# Patient Record
Sex: Female | Born: 1935 | Race: Black or African American | Hispanic: No | Marital: Single
Health system: Southern US, Community
[De-identification: ages and names within clinical notes are randomized; demographics above are authoritative.]

## PROBLEM LIST (undated history)

## (undated) DIAGNOSIS — H9190 Unspecified hearing loss, unspecified ear: Secondary | ICD-10-CM

## (undated) DIAGNOSIS — I10 Essential (primary) hypertension: Secondary | ICD-10-CM

---

## 2018-05-08 ENCOUNTER — Emergency Department (HOSPITAL_COMMUNITY): Payer: Self-pay

## 2018-05-08 ENCOUNTER — Other Ambulatory Visit: Payer: Self-pay

## 2018-05-08 ENCOUNTER — Observation Stay (HOSPITAL_COMMUNITY)
Admission: EM | Admit: 2018-05-08 | Discharge: 2018-05-09 | Disposition: A | Discharge status: Home / Self Care | Payer: Self-pay | Attending: Family Medicine | Admitting: Family Medicine

## 2018-05-08 DIAGNOSIS — J9859 Other diseases of mediastinum, not elsewhere classified: Secondary | ICD-10-CM | POA: Insufficient documentation

## 2018-05-08 DIAGNOSIS — I161 Hypertensive emergency: Principal | ICD-10-CM | POA: Insufficient documentation

## 2018-05-08 DIAGNOSIS — R011 Cardiac murmur, unspecified: Secondary | ICD-10-CM | POA: Insufficient documentation

## 2018-05-08 DIAGNOSIS — R404 Transient alteration of awareness: Secondary | ICD-10-CM

## 2018-05-08 DIAGNOSIS — D329 Benign neoplasm of meninges, unspecified: Secondary | ICD-10-CM | POA: Insufficient documentation

## 2018-05-08 LAB — AMMONIA: AMMONIA: 25 umol/L (ref 9–35)

## 2018-05-08 LAB — COMPREHENSIVE METABOLIC PANEL
ALT: 12 U/L (ref 0–44)
AST: 25 U/L (ref 15–41)
Albumin: 3.9 g/dL (ref 3.5–5.0)
Alkaline Phosphatase: 49 U/L (ref 38–126)
Anion gap: 15 (ref 5–15)
BUN: 15 mg/dL (ref 8–23)
CHLORIDE: 101 mmol/L (ref 98–111)
CO2: 26 mmol/L (ref 22–32)
CREATININE: 0.87 mg/dL (ref 0.44–1.00)
Calcium: 9.4 mg/dL (ref 8.9–10.3)
GFR, EST NON AFRICAN AMERICAN: 60 mL/min — AB (ref >60)
Glucose, Bld: 132 mg/dL — ABNORMAL HIGH (ref 70–99)
POTASSIUM: 3.8 mmol/L (ref 3.5–5.1)
Sodium: 142 mmol/L (ref 135–145)
Total Bilirubin: 1 mg/dL (ref 0.3–1.2)
Total Protein: 7.1 g/dL (ref 6.5–8.1)

## 2018-05-08 LAB — URINALYSIS, ROUTINE W REFLEX MICROSCOPIC
BILIRUBIN URINE: NEGATIVE
GLUCOSE, UA: NEGATIVE mg/dL
Hgb urine dipstick: NEGATIVE
KETONES UR: NEGATIVE mg/dL
Leukocytes, UA: NEGATIVE
Nitrite: NEGATIVE
PH: 9 — AB (ref 5.0–8.0)
PROTEIN: NEGATIVE mg/dL
Specific Gravity, Urine: 1.017 (ref 1.005–1.030)

## 2018-05-08 LAB — I-STAT CG4 LACTIC ACID, ED
LACTIC ACID, VENOUS: 1.54 mmol/L (ref 0.5–1.9)
LACTIC ACID, VENOUS: 1.24 mmol/L (ref 0.5–1.9)

## 2018-05-08 LAB — CBC WITH DIFFERENTIAL/PLATELET
Abs Immature Granulocytes: 0 10*3/uL (ref 0.0–0.1)
Basophils Absolute: 0 10*3/uL (ref 0.0–0.1)
Basophils Relative: 1 %
Eosinophils Absolute: 0.2 10*3/uL (ref 0.0–0.7)
Eosinophils Relative: 2 %
HCT: 43.4 % (ref 36.0–46.0)
Hemoglobin: 13.9 g/dL (ref 12.0–15.0)
IMMATURE GRANULOCYTES: 0 %
LYMPHS ABS: 1.6 10*3/uL (ref 0.7–4.0)
Lymphocytes Relative: 26 %
MCH: 29.6 pg (ref 26.0–34.0)
MCHC: 32 g/dL (ref 30.0–36.0)
MCV: 92.5 fL (ref 78.0–100.0)
MONOS PCT: 9 %
Monocytes Absolute: 0.6 10*3/uL (ref 0.1–1.0)
NEUTROS ABS: 3.9 10*3/uL (ref 1.7–7.7)
NEUTROS PCT: 62 %
Platelets: 198 10*3/uL (ref 150–400)
RBC: 4.69 MIL/uL (ref 3.87–5.11)
RDW: 12.8 % (ref 11.5–15.5)
WBC: 6.3 10*3/uL (ref 4.0–10.5)

## 2018-05-08 LAB — CBG MONITORING, ED: Glucose-Capillary: 111 mg/dL — ABNORMAL HIGH (ref 70–99)

## 2018-05-08 LAB — PROTIME-INR
INR: 1.04
PROTHROMBIN TIME: 13.5 s (ref 11.4–15.2)

## 2018-05-08 LAB — RAPID URINE DRUG SCREEN, HOSP PERFORMED
AMPHETAMINES: NOT DETECTED
Barbiturates: NOT DETECTED
Benzodiazepines: NOT DETECTED
Cocaine: NOT DETECTED
Opiates: NOT DETECTED
TETRAHYDROCANNABINOL: NOT DETECTED

## 2018-05-08 LAB — ETHANOL: Alcohol, Ethyl (B): <10 mg/dL (ref <10)

## 2018-05-08 LAB — LIPASE, BLOOD: Lipase: 33 U/L (ref 11–51)

## 2018-05-08 LAB — I-STAT TROPONIN, ED
TROPONIN I, POC: 0.04 ng/mL (ref 0.00–0.08)
TROPONIN I, POC: 0.03 ng/mL (ref 0.00–0.08)

## 2018-05-08 MED ORDER — LABETALOL HCL 5 MG/ML IV SOLN
10.0000 mg | Freq: Once | INTRAVENOUS | Status: AC
  Administered 2018-05-08: 10 mg via INTRAVENOUS
  Filled 2018-05-08: qty 4

## 2018-05-08 MED ORDER — STROKE: EARLY STAGES OF RECOVERY BOOK
Freq: Once | Status: AC
  Administered 2018-05-09: 06:00:00

## 2018-05-08 NOTE — ED Provider Notes (Signed)
[facility] EMERGENCY DEPARTMENT Provider Note   CSN: [id number] Arrival date & time: 05/08/18  1259     History   Chief Complaint No chief complaint on file.   HPI Rachel Webb is a 84 y.o. female.  The history is provided by the patient and medical records. The history is limited by a language barrier. A language interpreter was used.  Altered Mental Status   This is a new problem. The current episode started 3 to 5 hours ago. Associated symptoms include somnolence and unresponsiveness. Her past medical history is significant for hypertension (untreated in months). Her past medical history does not include diabetes, seizures, liver disease, CVA, TIA, COPD, dementia or head trauma. Past medical history comments: per son all of these were negative but htn.   LVL 5 caveat for AMS   No past medical history on file.  There are no active problems to display for this patient.    OB History   None      Home Medications    Prior to Admission medications   Not on File    Family History No family history on file.  Social History Social History   Tobacco Use  . Smoking status: Not on file  Substance Use Topics  . Alcohol use: Not on file  . Drug use: Not on file     Allergies   Milk-related compounds   Review of Systems Review of Systems  Unable to perform ROS: Patient unresponsive     Physical Exam Updated Vital Signs Ht 5' (1.524 m)   Wt 45.4 kg   SpO2 97% Comment: 2l  BMI 19.53 kg/m   Physical Exam  Constitutional: She appears well-developed and well-nourished. She appears distressed.  HENT:  Head: Normocephalic.  Nose: Nose normal.  Mouth/Throat: Oropharynx is clear and moist.  Eyes: Pupils are equal, round, and reactive to light. Conjunctivae are normal.  Neck: Neck supple.  Cardiovascular: Normal rate and intact distal pulses.  No murmur heard. Pulmonary/Chest: Effort normal and breath sounds normal. No respiratory  distress. She has no wheezes. She has no rales. She exhibits no tenderness.  Abdominal: Soft. She exhibits no distension. There is no tenderness.  Musculoskeletal: She exhibits no tenderness.  Neurological: No sensory deficit. She exhibits normal muscle tone. She displays no seizure activity. GCS eye subscore is 2. GCS verbal subscore is 4. GCS motor subscore is 5.  Initially, patient was somnolent and nearly unresponsive.  On arrival, patient would not respond to voice and only respond to painful stimuli.  She did localize to pain.  Patient's eyes were open with pain as well.  Next  Eventually, patient woke up and would answer questions and her language peripherally.  Skin: Capillary refill takes less than 2 seconds. No rash noted. She is not diaphoretic. No erythema.  Nursing note and vitals reviewed.    ED Treatments / Results  Labs (all labs ordered are listed, but only abnormal results are displayed) Labs Reviewed  COMPREHENSIVE METABOLIC PANEL - Abnormal; Notable for the following components:      Result Value   Glucose, Bld 132 (*)    GFR calc non Af Amer 60 (*)    All other components within normal limits  URINALYSIS, ROUTINE W REFLEX MICROSCOPIC - Abnormal; Notable for the following components:   APPearance CLOUDY (*)    pH 9.0 (*)    All other components within normal limits  CBG MONITORING, ED - Abnormal; Notable for the following components:  Glucose-Capillary 111 (*)    All other components within normal limits  URINE CULTURE  CBC WITH DIFFERENTIAL/PLATELET  PROTIME-INR  AMMONIA  LIPASE, BLOOD  ETHANOL  RAPID URINE DRUG SCREEN, HOSP PERFORMED  I-STAT CG4 LACTIC ACID, ED  I-STAT TROPONIN, ED  I-STAT CG4 LACTIC ACID, ED  I-STAT TROPONIN, ED    EKG EKG Interpretation  Date/Time:  Thursday May 08 2018 13:12:11 EDT Ventricular Rate:  76 PR Interval:    QRS Duration: 91 QT Interval:  385 QTC Calculation: 433 R Axis:   43 Text Interpretation:  Sinus  rhythm Probable LVH with secondary repol abnrm No prior ECG for comparison.  No STEMI Confirmed by Antony Blackbird [phone]) on 05/08/2018 1:39:53 PM   Radiology Dg Chest 2 View  Result Date: 05/08/2018 CLINICAL DATA:  Altered mental status EXAM: CHEST - 2 VIEW COMPARISON:  None. FINDINGS: Mild enlargement of the cardiopericardial silhouette. Prominence of ascending aortic contour. Otherwise normal mediastinal contour. No pneumothorax. No pleural effusion. No pulmonary edema. No acute consolidative airspace disease. IMPRESSION: 1. Prominence of the ascending aortic contour, cannot exclude ascending aortic aneurysm or acute aortic syndrome. If there is clinical concern for acute aortic syndrome, dissection protocol chest CT angiogram with IV contrast may be ordered at this time. Otherwise, short-term outpatient chest CT angiogram with IV contrast recommended for further evaluation. 2. Mild enlargement of the cardiopericardial silhouette, cannot exclude pericardial effusion. 3. No pulmonary edema.  No active pulmonary disease. Electronically Signed   By: Ilona Sorrel M.D.   On: 05/08/2018 14:43   Ct Head Wo Contrast  Result Date: 05/08/2018 CLINICAL DATA:  84 y/o  F; unresponsive to verbal. EXAM: CT HEAD WITHOUT CONTRAST TECHNIQUE: Contiguous axial images were obtained from the base of the skull through the vertex without intravenous contrast. COMPARISON:  None. FINDINGS: Brain: No evidence of acute infarction, hemorrhage, hydrocephalus, extra-axial collection or mass lesion/mass effect. Moderate chronic microvascular ischemic changes and volume loss of the brain. Vascular: Calcific atherosclerosis of carotid siphons. No hyperdense vessel. Skull: Normal. Negative for fracture or focal lesion. Sinuses/Orbits: No acute finding. Other: None. IMPRESSION: 1. No acute intracranial abnormality identified. 2. Moderate chronic microvascular ischemic changes and volume loss of the brain. Electronically Signed   By: Kristine Garbe M.D.   On: 05/08/2018 14:34    Procedures Procedures (including critical care time)  CRITICAL CARE Performed by: Gwenyth Allegra Celest Reitz Total critical care time: 35 minutes Critical care time was exclusive of separately billable procedures and treating other patients. Critical care was necessary to treat or prevent imminent or life-threatening deterioration. Critical care was time spent personally by me on the following activities: development of treatment plan with patient and/or surrogate as well as nursing, discussions with consultants, evaluation of patient's response to treatment, examination of patient, obtaining history from patient or surrogate, ordering and performing treatments and interventions, ordering and review of laboratory studies, ordering and review of radiographic studies, pulse oximetry and re-evaluation of patient's condition.   Medications Ordered in ED Medications  labetalol (NORMODYNE,TRANDATE) injection 10 mg (10 mg Intravenous Given 05/08/18 1400)  labetalol (NORMODYNE,TRANDATE) injection 10 mg (10 mg Intravenous Given 05/08/18 1526)     Initial Impression / Assessment and Plan / ED Course  I have reviewed the triage vital signs and the nursing notes.  Pertinent labs & imaging results that were available during my care of the patient were reviewed by me and considered in my medical decision making (see chart for details).     Phelicia Hawkey  is a 84 y.o. female with a past medical history significant for hypertension who has not been on medication since she arrived from [place] to [place] 4 months ago who presents for unresponsiveness and altered mental status.  Patient is calmly by son who was able to interpret for her.  EMS reports that patient was found in bed unresponsive this morning.  She was only responsive to painful stimuli during transport.  Glucose was normal in route.  Patient was hypertensive with blood pressures over 200 on  arrival.  Last normal was last night going to bed, unclear what time that was.  Family denies any history of cardiac disease or stroke.  On my initial assessment, patient would move her extremities when stimulated.  She had no facial droop seen.  Patient's pupils were 3 mm and reactive bilaterally.  Patient's lungs were clear and no murmur was appreciated.  Abdomen was nontender.  Back was nontender.  Patient is extremely altered and somnolent and only arousable to pain.    Patient's oxygen saturations were normal on room air and we felt she is protecting her airway.    Patient quickly went for CT head which showed evidence of chronic microvascular disease but no acute intracranial hemorrhage.    Patient's blood pressure continued to be over 301 systolic and she was given labetalol.  It improved into the 180s but then worsened again.  Patient given more labetalol.  Laboratory testing did not show any significant ab normalities.  Lactic is not elevated, troponin negative.  CBC reassuring.  Anabolic panel showed normal kidney and liver function.  Lipase not elevated.  Alcohol negative.  Chest x-ray showed prominence of the ascending aorta but otherwise did not show any new pneumonias or effusion.  Patient began to arouse and was able to speak in her language with her son.  She says she had no chest pain or shortness of breath but had a near syncopal episode this morning in the bathroom.  Patient then went to bed and was somnolent for several hours.    Clinically I am concerned patient has had either hypertensive emergency with a blood pressure over 200 with altered mental status versus a TIA leading to her altered mental status transiently.    With her blood pressure returning to the 220s, patient will be given more labetalol and will be admitted for further management for hypertensive emergency.  Patient may need MRI for her transient altered mental status however no focal neurologic deficits were  seen.  Patient may also need imaging of her aorta given the prominence that was seen on chest x-ray however with her lack of current neurologic deficits or any pain in her chest or abdomen or headache, have low suspicion for acute dissection.    Patient will be admitted for further management.   Final Clinical Impressions(s) / ED Diagnoses   Final diagnoses:  Hypertensive emergency  Transient alteration of awareness    ED Discharge Orders    None      Clinical Impression: 1. Hypertensive emergency   2. Transient alteration of awareness     Disposition: Admit  This note was prepared with assistance of Dragon voice recognition software. Occasional wrong-word or sound-a-like substitutions may have occurred due to the inherent limitations of voice recognition software.     Rachel Webb, Gwenyth Allegra, MD 05/08/18 [phone]

## 2018-05-08 NOTE — ED Notes (Signed)
Pt now speaking in response to family conversation. Denies pain.

## 2018-05-08 NOTE — ED Notes (Signed)
Pt returns from xray, sitting upright and responds to questions.

## 2018-05-08 NOTE — ED Notes (Signed)
Pt much more alert and following with eyes. MAEW resp even and non laobred,. Skin warm and dry.

## 2018-05-08 NOTE — ED Notes (Signed)
Paged neuro per MD

## 2018-05-08 NOTE — ED Notes (Signed)
Medical hx unknown to son at bedside.

## 2018-05-08 NOTE — Progress Notes (Signed)
Pt arrived on the unit safely from ED. Pt has family with her. Admissions notified. Vitals taken and documented.

## 2018-05-08 NOTE — ED Notes (Signed)
MD at bedsdie

## 2018-05-08 NOTE — ED Notes (Signed)
Pt to xray

## 2018-05-08 NOTE — ED Notes (Signed)
Attempted to call report

## 2018-05-08 NOTE — ED Notes (Signed)
Pt returns from CT.

## 2018-05-08 NOTE — ED Triage Notes (Addendum)
Pt arrives EMS from home with c/o unresponsive to verbal. Responds to pain.spontaneous open eyes. Arms equal resistant., LSN yesterday. Unknown time.  From [place]. Now visiting family for 4 months.

## 2018-05-08 NOTE — H&P (Addendum)
[facility] Admission History and Physical Service Pager: [phone]  Patient name: Rachel Webb Medical record number: [id number] Date of birth: 01/23/1933 Age: 84 y.o. Gender: female  Primary Care Provider: Patient, No Pcp Per Consultants: none Code Status: Full   Chief Complaint: AMS with hypertensive emergency  Assessment and Plan: Rachel Webb is a 84 y.o. female presenting with AMS and hypertensive emergency. PMH is significant for hypertension, hearing loss, and history of heart murmur.  Hypertensive emergency in setting of uncontrolled hypertension: Blood pressure 215/113 on arrival, received labetalol 10mg  x2, with lowest blood pressure decrease to 147'W systolic. Now remaining around 295-621'H systolic. Associated with AMS as below that improved following BP reduction, however no other associated signs concerning for emergency with Cr 0.87, LFTs wnl, CXR without acute cardiopulmonary disease, troponin negative x 1, and patient denies CP, headache, dizziness, lightheadedness. Question whether patient stays with quite elevated blood pressures at home, given her known history of hypertension without any controlling medications at home. Some concern for aortic dissection given prominence of ascending aortic contour on CXR in addition to elevated pressure, however reassured patient is without chest pain or back pain. This increased contour could just be related to her longstanding hypertension. However, low threshold for further imagine with CTA if any change in clinical assessment or symptomatology. While CT head is negative for acute intracranial process with associated presenting altered mental status, hesitant to continue blood pressure control until MRI head is obtained in case patient necessitates permissive hypertension for below.   -Admit to telemetry; attending Dr. Andria Frames  -Monitor BP, CM  -EKG in the am -S/p 20mg  IV labetalol in the ED; restart IV  labetalol pending MRI head  -BMP   -Vitals per routine  - ECHO  - CTA chest if patient clinically worsens, develops chest pain or back pain - Consider Neuro consult   Altered mental status: Resolved, however presented minimally responsive, only responsive to pain stimuli, following being found non-responsive at home. Now fully alert, oriented, and neurologically intact on our exam following labetalol 10mg  x2 in the ED. Etiology uncertain, likely most consistent with hypertensive emergency as above given her improved mental status following blood pressure control. Even though her CT head is negative for any acute intracranial abnormalities with a non-focal neurological exam, continue to have concern for TIA vs stroke especially in the setting of her uncontrolled hypertension. Patient with no focal neuro signs on exam or per history. Reported fall in home, but son reports she did not hit her head, so trauma less likely. Consideration for aortic dissection with prominence of ascending aortic contour on CXR and in hypertensive emergency on presentation, however reassured as she denies any chest pain or back pain. Low concern for precipitating seizure with no witness jerking movements, bowel/bladder incontinence, or history of previous seizures. Low suspicion for contributing metabolic abnormalities as electrolytes wnl, no hyperglycemia, no uremia, ammonia 25, U/A clear, urine tox negative, and alcohol level <10.    -MRI head  -CM, continuous pulse ox  -Obtain echocardiogram  -Bedside swallow study -A1c, lipid panel  -PT/OT   Cardiac murmur: 3/6 systolic murmur heard best in 2nd intercostal space. Son states they are aware of this murmur for the past several years, however unsure of etiology. Given location and characteristics, question aortic stenosis.  -Obtain Echo   FEN/GI: NPO pending swallow screen, then soft diet  Prophylaxis: Lovenox  Disposition: Admit to telemetry  History of Present  Illness:  Rachel Webb is a 84  y.o. female, with a past history significant for uncontrolled hypertension, presenting following a non-responsive episode and altered mental status, found to have severely elevated blood pressure. She is from [place], speaks [facility], and has been visiting her son in the [place] for the past few months, planning to return in the next month. Translation was performed by her son, translation service via phone was attempted, however with her difficulty hearing was unsuccessful.   Son reports she told him she fell this morning, he believes around 4-5am because that's when she wakes up pray and go to the restroom. Denies hitting her head, residual pain, or LOC with this episode. She was able to get up and go back into bed after this. Son is unsure if she went lightheaded or dizzy prior to this, but thinks she likely miss-stepped on a toy/clothes with the grandchildren around. Then around 12 pm, he went into her room to give her breakfast and found her laying on the bed. He tried to sit her up to eat, however she was not responding or opening her eyes. He called EMS at that time, who instructed him to watch her breathing, which was normal. Denies any bowel/bladder incontinence, jerking movements, or facial droop while not responding. When the paramedics arrived, she was not still responding. Son reports she started opening her eyes and becoming more alert when she started receiving blood pressure medications while in the ED. Prior to these episodes today, she was at her baseline and had no complaints. A few days ago, she told him she wasn't "feeling so good," received alka seltzer and had no further concerns. She generally walks 20-40 minutes in the morning with her grandchildren, and around 5 miles when back at home. She denies any chest pain, SOB, palpitations, dizziness/lightheadedness, abdominal pain, N/V, back pain, weakness, or numbness/tingling.   On arrival to the ED, she was minimally  responsive, would only respond to painful stimulation, but able to localize this pain. Afebrile, hemodynamically stable with exception of elevated BP to 215/113 on arrival. CT scan showing no acute intracranial abnormality, with mod chronic microvascular ischemic changes and volume loss. CXR showing prominence of ascending aortic contour, mild enlargement of cardio-pericardial silhouette, but no active pulmonary disease. CMP and CBC remarkable for glucose 132, Cr 0.87, AST/ALT wnl, WBC 6.3, hgb 13.9, plts 198. Alcohol level <10. LA 1.54. Troponin negative x1. EKG NSR with signs of LVH, no ST elevations. U/A cloudy but without signs of infection. Urine tox screen negative. Ammonia 25. She received labetalol 10mg  x2 with improvement in BP and started to arouse following treatment and answering questions.   Review Of Systems: Per HPI with the following additions:   Review of Systems  Constitutional: Negative for fever.  Eyes: Negative for blurred vision and double vision.  Respiratory: Negative for shortness of breath.   Cardiovascular: Negative for chest pain.  Gastrointestinal: Negative for abdominal pain and vomiting.  Musculoskeletal: Positive for falls. Negative for back pain.  Neurological: Positive for loss of consciousness. Negative for dizziness, weakness and headaches.    Patient Active Problem List   Diagnosis Date Noted  . Hypertensive emergency 05/08/2018    Past Medical History: No past medical history on file.  Past Surgical History: No known PSH.   Social History: Social History   Tobacco Use  . Smoking status: Not on file  Substance Use Topics  . Alcohol use: Not on file  . Drug use: Not on file   Additional social history: Visiting with son, daughter-in-law,  no alcohol, smoking or illicit drug use Please also refer to relevant sections of EMR.  Family History: No family history on file. No strokes, heart problems, seizures   Allergies and  Medications: Allergies  Allergen Reactions  . Milk-Related Compounds    No current facility-administered medications on file prior to encounter.    No current outpatient medications on file prior to encounter.    Objective: BP (!) 142/132 (BP Location: Left Arm)   Pulse 66   Temp 98.3 F (36.8 C) (Oral)   Resp 18   Ht 5' (1.524 m)   Wt 45.4 kg   LMP  (LMP Unknown)   SpO2 100%   BMI 19.53 kg/m  Exam: General: Alert, NAD, sitting up  HEENT: NCAT, MM dry, oropharynx nonerythematous  Cardiac: RRR 3/6 systolic murmur best heard in the R 2nd intercostal space  Lungs: Clear bilaterally, no increased WOB on RA   Abdomen: soft small abdomen, non-tender, non-distended, normoactive BS Msk: Moves all extremities spontaneously Neuro: Alert and oriented. Speech appropriate, answers questions appropriately. CN 2-12 intact. EOMI, pupils equal and reactive to light. Sensation intact. Muscle strength 5/5 in BUE and BLE. 2/4 bicep and patellar reflexes. Heel to shin and finger to nose intact bilaterally.   Ext: Warm, dry, 2+ distal pulses, no edema  Psych: appropriate affect   Labs and Imaging: CBC BMET  Recent Labs  Lab 05/08/18 1327  WBC 6.3  HGB 13.9  HCT 43.4  PLT 198   Recent Labs  Lab 05/08/18 1327  NA 142  K 3.8  CL 101  CO2 26  BUN 15  CREATININE 0.87  GLUCOSE 132*  CALCIUM 9.4    Dg Chest 2 View  Result Date: 05/08/2018 CLINICAL DATA:  Altered mental status EXAM: CHEST - 2 VIEW COMPARISON:  None. FINDINGS: Mild enlargement of the cardiopericardial silhouette. Prominence of ascending aortic contour. Otherwise normal mediastinal contour. No pneumothorax. No pleural effusion. No pulmonary edema. No acute consolidative airspace disease. IMPRESSION: 1. Prominence of the ascending aortic contour, cannot exclude ascending aortic aneurysm or acute aortic syndrome. If there is clinical concern for acute aortic syndrome, dissection protocol chest CT angiogram with IV contrast may  be ordered at this time. Otherwise, short-term outpatient chest CT angiogram with IV contrast recommended for further evaluation. 2. Mild enlargement of the cardiopericardial silhouette, cannot exclude pericardial effusion. 3. No pulmonary edema.  No active pulmonary disease. Electronically Signed   By: Ilona Sorrel M.D.   On: 05/08/2018 14:43   Ct Head Wo Contrast  Result Date: 05/08/2018 CLINICAL DATA:  84 y/o  F; unresponsive to verbal. EXAM: CT HEAD WITHOUT CONTRAST TECHNIQUE: Contiguous axial images were obtained from the base of the skull through the vertex without intravenous contrast. COMPARISON:  None. FINDINGS: Brain: No evidence of acute infarction, hemorrhage, hydrocephalus, extra-axial collection or mass lesion/mass effect. Moderate chronic microvascular ischemic changes and volume loss of the brain. Vascular: Calcific atherosclerosis of carotid siphons. No hyperdense vessel. Skull: Normal. Negative for fracture or focal lesion. Sinuses/Orbits: No acute finding. Other: None. IMPRESSION: 1. No acute intracranial abnormality identified. 2. Moderate chronic microvascular ischemic changes and volume loss of the brain. Electronically Signed   By: Kristine Garbe M.D.   On: 05/08/2018 14:34    Patriciaann Clan, DO 05/08/2018, 8:08 PM PGY-1, [facility] pager: [phone], text pages welcome  FPTS Upper-Level Resident Addendum   I have independently interviewed and examined the patient. I have discussed the above with the original  Chief Strategy Officer and agree with their documentation. My edits for correction/addition/clarification are in purple. Please see also any attending notes.    Martinique Qadir Folks, DO PGY-2, [facility] 05/08/2018 8:54 PM  FPTS Service pager: [phone] (text pages welcome through [facility])

## 2018-05-09 ENCOUNTER — Observation Stay (HOSPITAL_COMMUNITY): Payer: Self-pay

## 2018-05-09 ENCOUNTER — Observation Stay (HOSPITAL_BASED_OUTPATIENT_CLINIC_OR_DEPARTMENT_OTHER): Payer: Self-pay

## 2018-05-09 DIAGNOSIS — I351 Nonrheumatic aortic (valve) insufficiency: Secondary | ICD-10-CM

## 2018-05-09 DIAGNOSIS — R404 Transient alteration of awareness: Secondary | ICD-10-CM

## 2018-05-09 LAB — LIPID PANEL
Cholesterol: 168 mg/dL (ref 0–200)
HDL: 52 mg/dL (ref >40)
LDL CALC: 105 mg/dL — AB (ref 0–99)
Total CHOL/HDL Ratio: 3.2 RATIO
Triglycerides: 55 mg/dL (ref <150)
VLDL: 11 mg/dL (ref 0–40)

## 2018-05-09 LAB — CBC
HCT: 37.8 % (ref 36.0–46.0)
Hemoglobin: 12.3 g/dL (ref 12.0–15.0)
MCH: 29.6 pg (ref 26.0–34.0)
MCHC: 32.5 g/dL (ref 30.0–36.0)
MCV: 91.1 fL (ref 78.0–100.0)
Platelets: 178 10*3/uL (ref 150–400)
RBC: 4.15 MIL/uL (ref 3.87–5.11)
RDW: 12.9 % (ref 11.5–15.5)
WBC: 6.9 10*3/uL (ref 4.0–10.5)

## 2018-05-09 LAB — BASIC METABOLIC PANEL
ANION GAP: 10 (ref 5–15)
BUN: 11 mg/dL (ref 8–23)
CHLORIDE: 105 mmol/L (ref 98–111)
CO2: 26 mmol/L (ref 22–32)
Calcium: 9.2 mg/dL (ref 8.9–10.3)
Creatinine, Ser: 0.77 mg/dL (ref 0.44–1.00)
GFR calc non Af Amer: >60 mL/min (ref >60)
Glucose, Bld: 106 mg/dL — ABNORMAL HIGH (ref 70–99)
Potassium: 3.8 mmol/L (ref 3.5–5.1)
SODIUM: 141 mmol/L (ref 135–145)

## 2018-05-09 LAB — URINE CULTURE

## 2018-05-09 LAB — HEMOGLOBIN A1C
HEMOGLOBIN A1C: 5.7 % — AB (ref 4.8–5.6)
Mean Plasma Glucose: 116.89 mg/dL

## 2018-05-09 LAB — ECHOCARDIOGRAM COMPLETE
Height: 60 in
WEIGHTICAEL: 1600 [oz_av]

## 2018-05-09 MED ORDER — HYDROCHLOROTHIAZIDE 25 MG PO TABS
25.0000 mg | ORAL_TABLET | Freq: Every day | ORAL | Status: DC
  Administered 2018-05-09: 25 mg via ORAL
  Filled 2018-05-09: qty 1

## 2018-05-09 MED ORDER — HYDROCHLOROTHIAZIDE 25 MG PO TABS: 25.0000 mg | ORAL_TABLET | Freq: Every day | ORAL | 0 refills | Status: DC

## 2018-05-09 MED ORDER — HYDROCHLOROTHIAZIDE 25 MG PO TABS: 25.0000 mg | ORAL_TABLET | Freq: Every day | ORAL | Status: DC

## 2018-05-09 NOTE — Care Management Note (Signed)
Case Management Note  Patient Details  Name: Rachel Webb MRN: [id number] Date of Birth: [birth year]  Subjective/Objective:                    Action/Plan: Pt discharging home with her family. Pt has f/u appointment with [facility]. Pt's daughter in law provided a discount card to assist with the cost of her d/c medication. Family to provide supervision at home and transportation to home.   Expected Discharge Date:  05/09/18               Expected Discharge Plan:     In-House Referral:     Discharge planning Services  CM Consult, Medication Assistance  Post Acute Care Choice:    Choice offered to:     DME Arranged:    DME Agency:     HH Arranged:    HH Agency:     Status of Service:  Completed, signed off  If discussed at [employer] of Stay Meetings, dates discussed:    Additional Comments:  Pollie Friar, RN 05/09/2018, 3:57 PM

## 2018-05-09 NOTE — Evaluation (Signed)
Physical Therapy Evaluation Patient Details Name: Rachel Webb MRN: [id number] DOB: Apr 05, 1933 Today's Date: 05/09/2018   History of Present Illness  Patient is a 84 y/o female who was found unresponsive at home. Found to have AMS with hypertensive urgency. No PMH as pt visiting from [place].  Clinical Impression  Patient presents with elevated BP and mild balance deficits s/p above. Tolerated gait training and stair training with supervision for safety. Pt independent PTA, visiting family from [place]. Speaks [place] so daughter translated during session. Pt reports no symptoms or pain. Educated daughter about significance of having high BP and why it needs to be controlled. Difficult to assess cognition due to language barrier. Seems to be functioning close to baseline and anticipate pt's mobility will improve with increased activity. Will follow acutely while in hospital to maximize independence and mobility so pt can return to PLOF.     Follow Up Recommendations No PT follow up;Supervision - Intermittent    Equipment Recommendations  None recommended by PT    Recommendations for Other Services       Precautions / Restrictions Precautions Precautions: None Precaution Comments: watch BP Restrictions Weight Bearing Restrictions: No      Mobility  Bed Mobility Overal bed mobility: Needs Assistance Bed Mobility: Supine to Sit;Sit to Supine     Supine to sit: Supervision;HOB elevated Sit to supine: Supervision;HOB elevated   General bed mobility comments: No assist needed.   Transfers Overall transfer level: Needs assistance Equipment used: None Transfers: Sit to/from Stand Sit to Stand: Supervision         General transfer comment: Supervision for safety.   Ambulation/Gait Ambulation/Gait assistance: Supervision Gait Distance (Feet): 200 Feet Assistive device: None Gait Pattern/deviations: Step-through pattern;Decreased stride length;Drifts right/left Gait  velocity: decreased   General Gait Details: Slow, mostly steady gait with drifting to right/left noted. No overt LOB. Balance improved wth increased distance.   Stairs Stairs: Yes Stairs assistance: Supervision Stair Management: Alternating pattern;Two rails Number of Stairs: 3(+ 2 steps x2 bouts) General stair comments: Safe technique.  Wheelchair Mobility    Modified Rankin (Stroke Patients Only) Modified Rankin (Stroke Patients Only) Pre-Morbid Rankin Score: Slight disability Modified Rankin: Slight disability     Balance Overall balance assessment: Needs assistance Sitting-balance support: Feet supported;No upper extremity supported Sitting balance-Leahy Scale: Good     Standing balance support: During functional activity Standing balance-Leahy Scale: Good                               Pertinent Vitals/Pain Pain Assessment: No/denies pain    Home Living Family/patient expects to be discharged to:: Private residence Living Arrangements: Children Available Help at Discharge: Family;Available PRN/intermittently Type of Home: House Home Access: Stairs to enter Entrance Stairs-Rails: Right Entrance Stairs-Number of Steps: 8 Home Layout: Two level Home Equipment: None      Prior Function Level of Independence: Independent         Comments: Visiting family from [place], speaks [place].      Hand Dominance        Extremity/Trunk Assessment   Upper Extremity Assessment Upper Extremity Assessment: Defer to OT evaluation    Lower Extremity Assessment Lower Extremity Assessment: LLE deficits/detail;RLE deficits/detail RLE Deficits / Details: Demonstrates good functional strength RLE Sensation: WNL LLE Deficits / Details: Demonstrates good functional strength LLE Sensation: WNL       Communication   Communication: Prefers language other than English  Cognition Arousal/Alertness: Awake/alert Behavior  During Therapy: WFL for tasks  assessed/performed Overall Cognitive Status: Difficult to assess                                 General Comments: Per daughter, appears at baseline.      General Comments General comments (skin integrity, edema, etc.): Daughter present to interpret for patient.    Exercises     Assessment/Plan    PT Assessment Patient needs continued PT services  PT Problem List Decreased mobility;Decreased balance;Decreased strength       PT Treatment Interventions Functional mobility training;Balance training;Patient/family education;Gait training;Therapeutic activities;Stair training;Therapeutic exercise    PT Goals (Current goals can be found in the Care Plan section)  Acute Rehab PT Goals Patient Stated Goal: none stated PT Goal Formulation: With patient Time For Goal Achievement: 05/23/18 Potential to Achieve Goals: Good    Frequency Min 3X/week   Barriers to discharge Inaccessible home environment 8 stairs to enter home and to get to bedroom    Co-evaluation PT/OT/SLP Co-Evaluation/Treatment: Yes Reason for Co-Treatment: For patient/therapist safety PT goals addressed during session: Mobility/safety with mobility;Balance         AM-PAC PT "6 Clicks" Daily Activity  Outcome Measure Difficulty turning over in bed (including adjusting bedclothes, sheets and blankets)?: None Difficulty moving from lying on back to sitting on the side of the bed? : None Difficulty sitting down on and standing up from a chair with arms (e.g., wheelchair, bedside commode, etc,.)?: None Help needed moving to and from a bed to chair (including a wheelchair)?: None Help needed walking in hospital room?: A Little Help needed climbing 3-5 steps with a railing? : A Little 6 Click Score: 22    End of Session Equipment Utilized During Treatment: Gait belt Activity Tolerance: Patient tolerated treatment well(except elevated BP post activity, RN notified) Patient left: in bed;with call  bell/phone within reach;with bed alarm set;with family/visitor present Nurse Communication: Mobility status PT Visit Diagnosis: Unsteadiness on feet (R26.81)    Time: 4196-2229 PT Time Calculation (min) (ACUTE ONLY): 17 min   Charges:   PT Evaluation $PT Eval Moderate Complexity: 1 Mod          Wray Kearns, PT, DPT Acute Rehabilitation Services Pager [phone] Office [place] A Sabra Heck 05/09/2018, 10:41 AM

## 2018-05-09 NOTE — Discharge Instructions (Signed)
Please follow up in the [facility] for a blood pressure visit on Monday 9/9 at 2:00pm.   Please take your hydrochlorothiazide once daily until your visit.   Please also follow up in the [facility] on 9/17 at 8:30am.

## 2018-05-09 NOTE — Care Management Note (Signed)
Case Management Note  Patient Details  Name: Rachel Webb MRN: [id number] Date of Birth: 07/16/1933  Subjective/Objective:    Pt admitted with HTN emergency. She is from [place] up visiting her son.                 Action/Plan: Awaiting PT/OT evals. CM following for d/c needs. Pt may need medication assistance at d/c.   Expected Discharge Date:                  Expected Discharge Plan:     In-House Referral:     Discharge planning Services  CM Consult  Post Acute Care Choice:    Choice offered to:     DME Arranged:    DME Agency:     HH Arranged:    HH Agency:     Status of Service:  In process, will continue to follow  If discussed at Long Length of Stay Meetings, dates discussed:    Additional Comments:  Pollie Friar, RN 05/09/2018, 10:35 AM

## 2018-05-09 NOTE — Progress Notes (Signed)
FPTS Interim Progress Note Discuss with patient recent events, she denies any chest pain or tearing back pain in the past few days. She complains of generalized body ache. Patient has a history of HTN and was on medications. Since her arrival in the [place] for vacation, patient has not taken any medications for her HTN. She also was on low salt diet in her home country but for the past month she has had a regular diet. Based on clinical assessment, stable vitals signs and history obtained from patient less concern about dissection.We will continue to monitor and have low threshold for CT chest/abdomen with any changes in clinical status.  Marjie Skiff, MD 05/09/2018, 2:25 AM PGY-3, [facility] Service pager [phone]

## 2018-05-09 NOTE — Progress Notes (Signed)
SLP Cancellation Note  Patient Details Name: Vibha Ferdig MRN: [id number] DOB: 09-14-1932   Cancelled treatment:       Reason Eval/Treat Not Completed: SLP screened, no needs identified, will sign off.  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  Her daughter in law whom is present reported that she is back to baseline.    Shelly Flatten, MA, CCC-SLP Acute Rehab SLP [phone] Lamar Sprinkles 05/09/2018, 10:22 AM

## 2018-05-09 NOTE — Discharge Summary (Signed)
[facility] Discharge Summary  Patient name: Rachel Webb Medical record number: [id number] Date of birth: [birth year] Age: 84 y.o. 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 No distress.  HENT:  Head: Normocephalic.  Mouth/Throat: Oropharynx is clear and moist.  Eyes: Pupils are equal, round, and reactive to light. EOM are normal. Right eye exhibits no discharge. Left eye exhibits no discharge.  Neck: Normal range of motion.  Cardiovascular: Regular rhythm. Exam reveals no friction rub.  Murmur heard. Pulmonary/Chest: Effort normal and breath sounds normal. Rachel Webb has no wheezes. Rachel Webb has no rales. Rachel Webb exhibits no tenderness.  Abdominal: Soft. Bowel sounds are normal. Rachel Webb exhibits no distension. There is no tenderness.  Musculoskeletal: Rachel Webb exhibits no edema or tenderness.  Neurological: Rachel Webb is alert and oriented to person, place, and time. Coordination normal.  Skin: Skin is warm and dry. No rash noted. Rachel Webb is not diaphoretic. No erythema.  Vitals reviewed.   Brief Hospital Course:   Rachel Webb is a 84 year old woman visiting from [place] who presented with hypertensive emergency and altered mental status after being found nonresponsive on the morning of presentation. Her BP was measured at 225/113 in the ED where Rachel Webb was given 10mg  IV of Labetalol x2 with improvement of her blood  pressures.  For work-up of altered mental status Rachel Webb had an MRI that showed a 9mm meningioma which could potentially provide seizure focus.  However Rachel Webb has had no seizure-like activity and patient/family did not want further work-up of this issue.  Also of note Rachel Webb had a chest x-ray remarkable for a widened mediastinum that most likely could represent an ectatic aorta.  The possibility of an aortic dissection was ruled out as patient had no chest pain, back pain, shortness of breath, was stable throughout her hospital stay. Given the patient's age and temporary status in the [place] the family decided to not pursue further medical evaluation concerning the meningioma or cardiac findings.   Rachel Webb was started on hydrochlorothiazide at discharge and an echocardiogram was performed but not read prior to her discharge home.   Issues for Follow Up:   1. HTN: Started on 25mg  HCTZ daily at discharge, check BMP.  Consider adding Amlodipine 5mg  if blood pressures continue to be elevated 2. Follow-up on echocardiogram results    Significant Procedures: none  Significant Labs and Imaging:  Recent Labs  Lab 05/08/18 1327 05/09/18 0619  WBC 6.3 6.9  HGB 13.9 12.3  HCT 43.4 37.8  PLT 198 178   Recent Labs  Lab 05/08/18 1327 05/09/18 0619  NA 142 141  K 3.8 3.8  CL 101 105  CO2 26 26  GLUCOSE 132* 106*  BUN 15 11  CREATININE 0.87 0.77  CALCIUM 9.4 9.2  ALKPHOS 49  --   AST 25  --   ALT 12  --   ALBUMIN 3.9  --     Results/Tests Pending at Time of Discharge: Echocardiogram  Discharge Medications:  Allergies as of 05/09/2018  Reactions   Milk-related Compounds       Medication List    TAKE these medications   hydrochlorothiazide 25 MG tablet Commonly known as:  HYDRODIURIL Take 1 tablet (25 mg total) by mouth daily.       Discharge Instructions: Please refer to Patient Instructions section of EMR for full details.  Patient was counseled important signs and symptoms that should  prompt return to medical care, changes in medications, dietary instructions, activity restrictions, and follow up appointments.   Follow-Up Appointments:   Follow-up Information    [facility]. Go on 05/12/2018.   Why:  Nurse visit at 2 PM.  For blood pressure recheck Contact information: [place] [employer]       Marjie Skiff, MD. Go on 05/20/2018.   Specialty:  Family Medicine Why:  Hospital follow-up at 8:30 AM.  Please arrive 15 minutes early for check in. Contact information: [address] [place] [address] [phone]           Pattricia Boss, Medical Student 05/09/2018, 3:53 PM   FPTS Upper-Level Resident Addendum  I have independently interviewed and examined the patient. I have edited and discussed the above with the original author and agree with their documentation. Please see also any attending notes.   Bufford Lope, DO PGY-3, [facility] 05/09/2018 4:21 PM  FPTS Service pager: [phone] (text pages welcome through [place])

## 2018-05-09 NOTE — Progress Notes (Addendum)
Family Medicine Teaching Service Daily Progress Note Acting Intern Pager: [phone]  Patient name: Rachel Webb Medical record number: [id number] Date of birth: October 24, 1932 Age: 84 y.o. Gender: female  Primary Care Provider: Patient, No Pcp Per Consultants: none Code Status: full code   Pt Overview and Major Events to Date:  Hypertensive emergency with AMS   Assessment and Plan:  Hypertensive Emergency, Resolved  BP now 164/94 s/p 20mg  Labetalol in ED from BP on admit  225/113 -will continue to monitor BP  -f/u ECHO read   -start HCTZ later this afternoon to avoid decrease in MAP greater than 25% in the first 24h -will schedule nursing visit for BP for Mon 9/9, followed by f/u appt in 1 week  Resolved AMS in the setting of new Meningioma:  MRI completed on 05/08/18,14 mm meningioma found without acute intracranial process.  May be a possible focus for AMS but as patient back to her baseline and patient/family do not want neurological work up. -will continue to monitor for development of focal neuro deficits   Essential Hypertension, Uncontrolled.  Rachel Webb reports only taking BP medications in [place] when her BP is elevated and the hospital administers them, she is not on medications on a regular basis at home.  BP is today is 164/94 from 225/113 on admission.  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 Definitive diagnosis would require further imagine that family and patient are not interested in pursuing due to social and cultural circumstances. Considering age ectatic aorta is high on the differential. Aortic aneurysm possible due likely longstanding, uncontrolled HTN. Aortic dissection very unlikely as patient is stable, no chest pain/back  pain, no SOB or other red flag signs or symptoms.  -will f/u Echo results   Cardiac Murmur: On admission, cardiac murmur characteristic of aortic stenosis -will f/u Echo results   FEN/GI: soft diet  PPx: SCDs  Disposition: home today with close follow-up   Subjective:  Rachel Webb (translated by her daughter in law) states that she has no new complaints this morning and is in no pain.  She denies nausea, vomiting, headache, dizziness, SOB, Chest pain, back pain.  Objective: Temp:  [97.6 F (36.4 C)-99.3 F (37.4 C)] 99.3 F (37.4 C) (09/06 0733) Pulse Rate:  [58-89] 74 (09/06 0733) Resp:  [16-32] 18 (09/06 0733) BP: (141-225)/(66-198) 152/97 (09/06 1017) SpO2:  [95 %-100 %] 99 % (09/06 0733) Weight:  [45.4 kg] 45.4 kg (09/05 1308)  Physical Exam: General: thin elderly female lying in bed in NAD  Cardiovascular: grade III systolic murmur with regular rhythm noted on exam Respiratory: CTAB, normal work of breathing, no crackles or wheezing, patient is aerating well on RA  Abdomen: soft, NT, ND, +BS throughout, no masses palpated on exam  Extremities: non-edematous  Neuro: intact sensation, 5/5 strength throughout, EOMI intact, AA x3  Laboratory: Recent Labs  Lab 05/08/18 1327 05/09/18 0619  WBC 6.3 6.9  HGB 13.9 12.3  HCT 43.4 37.8  PLT 198 178   Recent Labs  Lab 05/08/18 1327 05/09/18 0619  NA 142 141  K 3.8 3.8  CL 101 105  CO2 26 26  BUN 15 11  CREATININE 0.87 0.77  CALCIUM 9.4 9.2  PROT 7.1  --   BILITOT 1.0  --  ALKPHOS 49  --   ALT 12  --   AST 25  --   GLUCOSE 132* 106*      Imaging/Diagnostic Tests:  MRI Brain w/o contrast  IMPRESSION: 1. No acute intracranial abnormality. 2. Age-related cerebral atrophy with moderate chronic small vessel ischemic disease. 3. 14 mm meningioma along the anterior/inferior falx. Mild localized vasogenic edema without significant regional mass effect.  CXR   IMPRESSION: 1. Prominence of the ascending  aortic contour, cannot exclude ascending aortic aneurysm or acute aortic syndrome. If there is clinical concern for acute aortic syndrome, dissection protocol chest CT angiogram with IV contrast may be ordered at this time. Otherwise, short-term outpatient chest CT angiogram with IV contrast recommended for further evaluation. 2. Mild enlargement of the cardiopericardial silhouette, cannot exclude pericardial effusion. 3. No pulmonary edema.  No active pulmonary disease.  Pattricia Boss, Medical Student 05/09/2018, 11:04 AM Acting Intern pager: [phone]  FPTS Upper-Level Resident Addendum  I have independently interviewed and examined the patient. I have edited and discussed the above with the original author and agree with their documentation. Please see also any attending notes.   Bufford Lope, DO PGY-3, [facility] 05/09/2018 4:14 PM  FPTS Service pager: [phone] (text pages welcome through [place])

## 2018-05-09 NOTE — Evaluation (Signed)
Occupational Therapy Evaluation Patient Details Name: Rachel Webb MRN: [id number] DOB: [birth year] Today's Date: 05/09/2018    History of Present Illness Patient is a 84 y/o female who was found unresponsive at home. Found to have AMS with hypertensive urgency. No PMH as pt visiting from [place].   Clinical Impression   Patient evaluated by Occupational Therapy with no further acute OT needs identified. All education has been completed and the patient has no further questions. See below for any follow-up Occupational Therapy or equipment needs. OT to sign off. Thank you for referral.   BP at end of session was 175/95 no symptoms reported    Follow Up Recommendations  none  Equipment Recommendations     none  Recommendations for Other Services       Precautions / Restrictions Precautions Precautions: None Precaution Comments: watch BP Restrictions Weight Bearing Restrictions: No      Mobility Bed Mobility Overal bed mobility: Needs Assistance Bed Mobility: Supine to Sit;Sit to Supine     Supine to sit: Supervision;HOB elevated Sit to supine: Supervision;HOB elevated   General bed mobility comments: No assist needed.   Transfers Overall transfer level: Needs assistance Equipment used: None Transfers: Sit to/from Stand Sit to Stand: Supervision         General transfer comment: Supervision for safety.     Balance Overall balance assessment: Needs assistance Sitting-balance support: Feet supported;No upper extremity supported Sitting balance-Leahy Scale: Good     Standing balance support: During functional activity Standing balance-Leahy Scale: Good                             ADL either performed or assessed with clinical judgement   ADL Overall ADL's : Independent                                       General ADL Comments: completed bed transfer, basic transfer, tub transfer, change of position, stepping over object and  sink level . No deficits noted.      Vision   Vision Assessment?: No apparent visual deficits     Perception     Praxis      Pertinent Vitals/Pain Pain Assessment: No/denies pain     Hand Dominance Right   Extremity/Trunk Assessment Upper Extremity Assessment Upper Extremity Assessment: Overall WFL for tasks assessed   Lower Extremity Assessment Lower Extremity Assessment: Defer to PT evaluation RLE Deficits / Details: Demonstrates good functional strength RLE Sensation: WNL LLE Deficits / Details: Demonstrates good functional strength LLE Sensation: WNL   Cervical / Trunk Assessment Cervical / Trunk Assessment: Normal   Communication Communication Communication: Prefers language other than English   Cognition Arousal/Alertness: Awake/alert Behavior During Therapy: WFL for tasks assessed/performed Overall Cognitive Status: Difficult to assess                                 General Comments: Per daughter, appears at baseline.   General Comments  IV in L arm was out on arrival.  See vitals for BP. Pt did end session with BP 175/95    Exercises     Shoulder Instructions      Home Living Family/patient expects to be discharged to:: Private residence Living Arrangements: Children Available Help at Discharge: Family;Available PRN/intermittently Type of Home: House Home Access:  Stairs to enter [employer] of Steps: 8 Entrance Stairs-Rails: Right Home Layout: Two level Alternate Level Stairs-Number of Steps: 8 Alternate Level Stairs-Rails: Right Bathroom Shower/Tub: Teacher, early years/pre: Standard     Home Equipment: None   Additional Comments: daughter present during evaluation. daughter translating but required cues from therapist to translate during session.       Prior Functioning/Environment Level of Independence: Independent        Comments: Visiting family from [place], speaks [place].         OT  Problem List:        OT Treatment/Interventions:      OT Goals(Current goals can be found in the care plan section) Acute Rehab OT Goals Patient Stated Goal: none stated Potential to Achieve Goals: Good  OT Frequency:     Barriers to D/C:            Co-evaluation PT/OT/SLP Co-Evaluation/Treatment: Yes Reason for Co-Treatment: For patient/therapist safety PT goals addressed during session: Mobility/safety with mobility;Balance OT goals addressed during session: ADL's and self-care;Proper use of Adaptive equipment and DME      AM-PAC PT "6 Clicks" Daily Activity     Outcome Measure Help from another person eating meals?: None Help from another person taking care of personal grooming?: None Help from another person toileting, which includes using toliet, bedpan, or urinal?: None Help from another person bathing (including washing, rinsing, drying)?: None Help from another person to put on and taking off regular upper body clothing?: None Help from another person to put on and taking off regular lower body clothing?: None 6 Click Score: 24   End of Session Equipment Utilized During Treatment: Gait belt Nurse Communication: Mobility status;Precautions  Activity Tolerance: Patient tolerated treatment well Patient left: in bed;with call bell/phone within reach;with family/visitor present                   Time: 1011-1028 OT Time Calculation (min): 17 min Charges:  OT General Charges $OT Visit: 1 Visit OT Evaluation $OT Eval Low Complexity: 1 Low   Jeri Modena, OTR/L  Acute Rehabilitation Services Pager: [phone] Office: [phone] .   Parke Poisson B 05/09/2018, 11:47 AM

## 2018-05-09 NOTE — Evaluation (Signed)
Clinical/Bedside Swallow Evaluation Patient Details  Name: Rachel Webb MRN: [id number] Date of Birth: [birth year]  Today's Date: 05/09/2018 Time: SLP Start Time (ACUTE ONLY): 0945 SLP Stop Time (ACUTE ONLY): 1008 SLP Time Calculation (min) (ACUTE ONLY): 23 min  Past Medical History: No past medical history on file.  HPI:  Rachel Webb is a 84 y.o. female, with a past history significant for uncontrolled hypertension, presenting following a non-responsive episode and altered mental status, found to have severely elevated blood pressure. She is from [place], speaks [facility], and has been visiting her son in the [place] for the past few months, planning to return in the next month. Translation was performed by her son, translation service via phone was attempted, however with her difficulty hearing was unsuccessful.   Son reports she told him she fell this morning, he believes around 4-5am. Denies hitting her head, residual pain, or LOC with this episode. She was able to get up and go back into bed after this. Son is unsure if she was lightheaded or dizzy prior to this, but thinks she likely miss-stepped on a toy/clothes with the grandchildren around. Then around 12 pm, he went into her room to give her breakfast and found her laying on the bed. He tried to sit her up to eat, however she was not responding or opening her eyes. He called EMS at that time, who instructed him to watch her breathing, which was normal. Denies any bowel/bladder incontinence, jerking movements, or facial droop while not responding. When the paramedics arrived, she was not still responding. Son reports she started opening her eyes and becoming more alert when she started receiving blood pressure medications while in the ED. Prior to these episodes today, she was at her baseline and had no complaints. A few days ago, she told him she wasn't "feeling so good," received alka seltzer and had no further concerns. She generally walks 20-40  minutes in the morning with her grandchildren, and around 5 miles when back at home. She denies any chest pain, SOB, palpitations, dizziness/lightheadedness, abdominal pain, N/V, back pain, weakness, or numbness/tingling.   On arrival to the ED, she was minimally responsive, would only respond to painful stimulation, but able to localize this pain. Afebrile, hemodynamically stable with exception of elevated BP to 215/113 on arrival. CT scan showing no acute intracranial abnormality, with mod chronic microvascular ischemic changes and volume loss. CXR showing prominence of ascending aortic contour, mild enlargement of cardio-pericardial silhouette, but no active pulmonary disease. CMP and CBC remarkable for glucose 132, Cr 0.87, AST/ALT wnl, WBC 6.3, hgb 13.9, plts 198. Alcohol level <10. LA 1.54. Troponin negative x1. EKG NSR with signs of LVH, no ST elevations. U/A cloudy but    Assessment / Plan / Recommendation Clinical Impression  Clinical swallowing evaluation was competed using thin liquids via spoon, cup and straw, pureed material and dry solids in settnig for admission due to AMS.  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  She responds well to gestures to understand others and her daughter in law reported that she reads lips.  She is able to speak and does so in her native language with a quiet voice.  Her daughter in law was able to understand her when she spoke.  She is reporting that she is back to baseline.  Oral mechasnism exam was completed and remarkable for limited jaw/tongue differentiation during elevation.  All other structures and function appeared to be adequate.  Patient unable to understand directive to produce volitional cough.   Her oral and pharyngeal swallow appeared to be functional.  Swallow trigger appeared to be timely and hyo-laryngeal movement was appreciated to palpation.  Mastication of dry solids appeared to be functional despite lack of dentition.   No overt s/s of aspiration were seen.   Recommend a regular diet with thin liquds.  ST follow up is not indicated at this time.  If we can be of further assistance please feel free to reconsult.     SLP Visit Diagnosis: Dysphagia, unspecified (R13.10)    Aspiration Risk    No limitations.     Diet Recommendation   Regular with thin liquids.  Medication Administration: Whole meds with liquid    Other  Recommendations Oral Care Recommendations: Oral care BID   Follow up Recommendations None        Swallow Study   General Date of Onset: 05/08/18 HPI: Rachel Webb is a 83 y.o. female, with a past history significant for uncontrolled hypertension, presenting following a non-responsive episode and altered mental status, found to have severely elevated blood pressure. She is from [place], speaks [facility], and has been visiting her son in the [place] for the past few months, planning to return in the next month. Translation was performed by her son, translation service via phone was attempted, however with her difficulty hearing was unsuccessful.   Son reports she told him she fell this morning, he believes around 4-5am. Denies hitting her head, residual pain, or LOC with this episode. She was able to get up and go back into bed after this. Son is unsure if she was lightheaded or dizzy prior to this, but thinks she likely miss-stepped on a toy/clothes with the grandchildren around. Then around 12 pm, he went into her room to give her breakfast and found her laying on the bed. He tried to sit her up to eat, however she was not responding or opening her eyes. He called EMS at that time, who instructed him to watch her breathing, which was normal. Denies any bowel/bladder incontinence, jerking movements, or facial droop while not responding. When the paramedics arrived, she was not still responding. Son reports she started opening her eyes and becoming more alert when she started receiving blood pressure  medications while in the ED. Prior to these episodes today, she was at her baseline and had no complaints. A few days ago, she told him she wasn't "feeling so good," received alka seltzer and had no further concerns. She generally walks 20-40 minutes in the morning with her grandchildren, and around 5 miles when back at home. She denies any chest pain, SOB, palpitations, dizziness/lightheadedness, abdominal pain, N/V, back pain, weakness, or numbness/tingling.   On arrival to the ED, she was minimally responsive, would only respond to painful stimulation, but able to localize this pain. Afebrile, hemodynamically stable with exception of elevated BP to 215/113 on arrival. CT scan showing no acute intracranial abnormality, with mod chronic microvascular ischemic changes and volume loss. CXR showing prominence of ascending aortic contour, mild enlargement of cardio-pericardial silhouette, but no active pulmonary disease. CMP and CBC remarkable for glucose 132, Cr 0.87, AST/ALT wnl, WBC 6.3, hgb 13.9, plts 198. Alcohol level <10. LA 1.54. Troponin negative x1. EKG NSR with signs of LVH, no ST elevations. U/A cloudy but  Type of Study: Bedside Swallow Evaluation Previous Swallow Assessment: None noted at [facility]. Diet Prior to this Study: NPO Temperature Spikes Noted: No Respiratory Status: Room  air History of Recent Intubation: No Behavior/Cognition: Alert;Cooperative Oral Cavity Assessment: Within Functional Limits Oral Care Completed by SLP: No Oral Cavity - Dentition: Edentulous;Dentures, not available Vision: Functional for self-feeding Self-Feeding Abilities: Able to feed self Patient Positioning: Upright in bed Baseline Vocal Quality: Normal;Low vocal intensity Volitional Cough: (Unable to elicit) Volitional Swallow: Able to elicit    Oral/Motor/Sensory Function Overall Oral Motor/Sensory Function: Within functional limits   Ice Chips Ice chips: Not tested   Thin Liquid Thin Liquid: Within  functional limits Presentation: Cup;Spoon;Straw;Self Fed    Nectar Thick Nectar Thick Liquid: Not tested   Honey Thick Honey Thick Liquid: Not tested   Puree Puree: Within functional limits Presentation: Self Fed;Spoon   Solid     Solid: Within functional limits Presentation: [place], [place], [facility] SLP [phone] Lamar Sprinkles 05/09/2018,10:21 AM

## 2018-05-09 NOTE — Progress Notes (Signed)
  Echocardiogram 2D Echocardiogram has been performed.  Rachel Webb 05/09/2018, 12:30 PM

## 2018-05-12 ENCOUNTER — Ambulatory Visit: Payer: Self-pay

## 2018-05-17 ENCOUNTER — Emergency Department (HOSPITAL_COMMUNITY)
Admission: EM | Admit: 2018-05-17 | Discharge: 2018-05-18 | Disposition: A | Discharge status: Home / Self Care | Payer: Self-pay | Attending: Emergency Medicine | Admitting: Emergency Medicine

## 2018-05-17 ENCOUNTER — Encounter (HOSPITAL_COMMUNITY): Payer: Self-pay

## 2018-05-17 ENCOUNTER — Other Ambulatory Visit: Payer: Self-pay

## 2018-05-17 DIAGNOSIS — R441 Visual hallucinations: Secondary | ICD-10-CM | POA: Insufficient documentation

## 2018-05-17 DIAGNOSIS — I10 Essential (primary) hypertension: Secondary | ICD-10-CM | POA: Insufficient documentation

## 2018-05-17 HISTORY — DX: Essential (primary) hypertension: I10

## 2018-05-17 HISTORY — DX: Unspecified hearing loss, unspecified ear: H91.90

## 2018-05-17 LAB — CBC WITH DIFFERENTIAL/PLATELET
BASOS ABS: 0 10*3/uL (ref 0.0–0.1)
BASOS PCT: 0 %
Eosinophils Absolute: 0.1 10*3/uL (ref 0.0–0.7)
Eosinophils Relative: 1 %
HEMATOCRIT: 43 % (ref 36.0–46.0)
HEMOGLOBIN: 14.6 g/dL (ref 12.0–15.0)
Lymphocytes Relative: 24 %
Lymphs Abs: 1.7 10*3/uL (ref 0.7–4.0)
MCH: 30.3 pg (ref 26.0–34.0)
MCHC: 34 g/dL (ref 30.0–36.0)
MCV: 89.2 fL (ref 78.0–100.0)
Monocytes Absolute: 0.5 10*3/uL (ref 0.1–1.0)
Monocytes Relative: 8 %
NEUTROS ABS: 4.7 10*3/uL (ref 1.7–7.7)
NEUTROS PCT: 67 %
Platelets: 232 10*3/uL (ref 150–400)
RBC: 4.82 MIL/uL (ref 3.87–5.11)
RDW: 12.7 % (ref 11.5–15.5)
WBC: 7 10*3/uL (ref 4.0–10.5)

## 2018-05-17 LAB — URINALYSIS, ROUTINE W REFLEX MICROSCOPIC
BILIRUBIN URINE: NEGATIVE
Glucose, UA: NEGATIVE mg/dL
Hgb urine dipstick: NEGATIVE
KETONES UR: NEGATIVE mg/dL
Nitrite: NEGATIVE
PROTEIN: NEGATIVE mg/dL
Specific Gravity, Urine: 1.017 (ref 1.005–1.030)
pH: 7 (ref 5.0–8.0)

## 2018-05-17 LAB — COMPREHENSIVE METABOLIC PANEL
ALBUMIN: 4.2 g/dL (ref 3.5–5.0)
ALK PHOS: 57 U/L (ref 38–126)
ALT: 16 U/L (ref 0–44)
AST: 27 U/L (ref 15–41)
Anion gap: 13 (ref 5–15)
BILIRUBIN TOTAL: 1.1 mg/dL (ref 0.3–1.2)
BUN: 26 mg/dL — AB (ref 8–23)
CO2: 32 mmol/L (ref 22–32)
Calcium: 9.9 mg/dL (ref 8.9–10.3)
Chloride: 95 mmol/L — ABNORMAL LOW (ref 98–111)
Creatinine, Ser: 0.92 mg/dL (ref 0.44–1.00)
GFR calc Af Amer: >60 mL/min (ref >60)
GFR calc non Af Amer: 56 mL/min — ABNORMAL LOW (ref >60)
GLUCOSE: 128 mg/dL — AB (ref 70–99)
Potassium: 3.6 mmol/L (ref 3.5–5.1)
Sodium: 140 mmol/L (ref 135–145)
Total Protein: 7.9 g/dL (ref 6.5–8.1)

## 2018-05-17 LAB — RAPID URINE DRUG SCREEN, HOSP PERFORMED
Amphetamines: NOT DETECTED
BARBITURATES: NOT DETECTED
BENZODIAZEPINES: NOT DETECTED
Cocaine: NOT DETECTED
Opiates: NOT DETECTED
Tetrahydrocannabinol: NOT DETECTED

## 2018-05-17 LAB — ETHANOL: Alcohol, Ethyl (B): <10 mg/dL (ref <10)

## 2018-05-17 NOTE — ED Notes (Signed)
Patient walked to bathroom, no pure wick used

## 2018-05-17 NOTE — Discharge Instructions (Signed)
Return for any worsening of your symptoms or concerns.

## 2018-05-17 NOTE — BH Assessment (Addendum)
Assessment Note  Rachel Webb is an 84 y.o. female. Pt brought to [facility] by family who are reporting pt has been having visual hallucinations for deceased relative.  Pt is both deaf and non-english speaking. Pt is from [place], speaks [place], and has been visiting since May. Pt unable to answer questions and most information provided by family. Pt fell 2 weeks ago and was admitted to [facility] with high blood pressure. Since that time, she has been talking to relatives who have been dead for years, including calling [place] and asking to speak to these people.  She also has been fearful at the home and appears to be looking at something (visual hallucination).  Family reports no history of any mental health problems.  No substance use history.  No SI/HI reported.  Pt is scheduled to return to [place] in one week and family is concerned about pt blood pressure but does not want medication.  Family plan is for pt to return to [place] as scheduled and family will be there to receive her.     Diagnosis: Deferred  Past Medical History:  Past Medical History:  Diagnosis Date  . Deaf   . Hypertension       Family History: No family history on file.  Social History:  has no tobacco, alcohol, and drug history on file.  Additional Social History:  Alcohol / Drug Use Pain Medications: No alcohol/drug use reported History of alcohol / drug use?: No history of alcohol / drug abuse  CIWA: CIWA-Ar BP: 133/89 Pulse Rate: 82 COWS:    Allergies:  Allergies  Allergen Reactions  . Milk-Related Compounds     Home Medications:  (Not in a hospital admission)  OB/GYN Status:  No LMP recorded (lmp unknown). Patient is postmenopausal.  General Assessment Data Location of Assessment: [facility] ED TTS Assessment: In system Is this a Tele or Face-to-Face Assessment?: Face-to-Face Is this an Initial Assessment or a Re-assessment for this encounter?: Initial Assessment Patient Accompanied by::  Adult(adult son, 2 adult granddaughters) Language Other than English: Yes What is your preferred language: Other (Comment: Enter the language)([place] language from [place], [place]) Living Arrangements: Other (Comment)(with relatives) What gender do you identify as?: Female Marital status: Widowed Pregnancy Status: Unknown Living Arrangements: Children, Other relatives Can pt return to current living arrangement?: Yes Admission Status: Voluntary Is patient capable of signing voluntary admission?: Yes Referral Source: Self/Family/Friend     Crisis Care Plan Living Arrangements: Children, Other relatives Name of Psychiatrist: na Name of Therapist: na  Education Status Is patient currently in school?: No Is the patient employed, 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 : No Suicidal Intent: No Has patient had any suicidal intent within the past 6 months prior to admission? : No Is patient at risk for suicide?: No Suicidal Plan?: No Has patient had any suicidal plan within the past 6 months prior to admission? : No Access to Means: No What has been your use of drugs/alcohol within the last 12 months?: none reported Previous Attempts/Gestures: No Intentional Self Injurious Behavior: None Family Suicide History: No Recent stressful life event(s): Other (Comment)(visiting from [place] since 01/2018.  recent fall at home) Persecutory voices/beliefs?: No Depression: No Substance abuse history and/or treatment for substance abuse?: No  Risk to Others within the past 6 months Homicidal Ideation: No Does patient  have any lifetime risk of violence toward others beyond the six months prior to admission? : No Thoughts of Harm to Others: No Current Homicidal Intent: No Current Homicidal Plan: No Access to Homicidal Means: No History  of harm to others?: No Assessment of Violence: None Noted Does patient have access to weapons?: No Criminal Charges Pending?: No Does patient have a court date: No Is patient on probation?: No  Psychosis Hallucinations: Visual(seeing dead relatives)  Mental Status Report Appearance/Hygiene: Unremarkable Eye Contact: Fair Motor Activity: Unremarkable Speech: Rapid, Logical/coherent Level of Consciousness: Alert Mood: Pleasant Affect: Appropriate to circumstance Anxiety Level: Minimal Thought Processes: Relevant Judgement: Impaired Obsessive Compulsive Thoughts/Behaviors: None  Cognitive Functioning Concentration: Unable to Assess Memory: Unable to Assess Is patient IDD: No Insight: Poor Impulse Control: Poor Appetite: Good Have you had any weight changes? : No Change Sleep: Decreased Total Hours of Sleep: (wakes more frequently) Vegetative Symptoms: (less talkative with family)  ADLScreening ([place]) Patient's cognitive ability adequate to safely complete daily activities?: Yes Patient able to express need for assistance with ADLs?: Yes Independently performs ADLs?: Yes (appropriate for developmental age)  Prior Inpatient Therapy Prior Inpatient Therapy: No  Prior Outpatient Therapy Prior Outpatient Therapy: No Does patient have an ACCT team?: No Does patient have Intensive In-House Services?  : No Does patient have [facility] services? : No Does patient have P4CC services?: No  ADL Screening (condition at time of admission) Patient's cognitive ability adequate to safely complete daily activities?: Yes Patient able to express need for assistance with ADLs?: Yes Independently performs ADLs?: Yes (appropriate for developmental age)             Regulatory affairs officer (For Healthcare) Does Patient Have a Medical Advance Directive?: No Would patient like information on creating a medical advance directive?: No - Patient declined           Disposition: TTS spoke to Darlyne Russian, [place], who recommends another scan of pt head but would not recommend psychiatric medication at this time, and would support discharge home with family.  TTS discussed pt with Dr. Lita Mains and shared this recommendation. Disposition Initial Assessment Completed for this Encounter: Yes  On Site Evaluation by:   Reviewed with Physician:    Joanne Chars 05/17/2018 10:18 PM

## 2018-05-17 NOTE — ED Notes (Signed)
Placed patient on purewick. Will collect urine sample when patient voids.

## 2018-05-17 NOTE — ED Provider Notes (Signed)
[facility] DEPT Provider Note   CSN: [id number] Arrival date & time: 05/17/18  1452     History   Chief Complaint Chief Complaint  Patient presents with  . Altered Mental Status  . Hearing Problem    HPI Rachel Webb is a 84 y.o. female.  HPI   Patient is originally from [place].  She is been here in the [place] for 4 months visiting family.  She was admitted to the hospital for unresponsive episode and hypertension earlier this month.  Family member in the room state that she has not been at her baseline since.  She has had increasing episodes of talking to people that are not there and increased anxiety.  Per family she was unresponsive today with eyes closed.  Unsure how long this episode lasted.  Called EMS.  Per EMS patient was unresponsive.  She does have a language barrier.  Per family member patient has not been complaining of any pain including chest pain, abdominal pain.  States that she has been noted to have strong smelling urine.  Question whether she may have a urinary tract infection.  No known fevers or chills.  Level 5 caveat.       Past Medical History:  Diagnosis Date  . Deaf   . Hypertension     Patient Active Problem List   Diagnosis Date Noted  . Transient alteration of awareness   . Hypertensive emergency 05/08/2018       OB History   None      Home Medications    Prior to Admission medications   Medication Sig Start Date End Date Taking? Authorizing Provider  hydrochlorothiazide (HYDRODIURIL) 25 MG tablet Take 1 tablet (25 mg total) by mouth daily. 05/09/18   Shirley, [place], DO    Family History No family history on file.  Social History Social History   Tobacco Use  . Smoking status: Not on file  Substance Use Topics  . Alcohol use: Not on file  . Drug use: Not on file     Allergies   Milk-related compounds   Review of Systems Review of Systems  Unable to perform ROS: Mental status change      Physical Exam Updated Vital Signs BP 133/89 (BP Location: Right Arm)   Pulse 82   Temp (!) 97.5 F (36.4 C) (Oral)   Resp (!) 22   Ht 5' (1.524 m)   Wt 45.3 kg   LMP  (LMP Unknown)   SpO2 100%   BMI 19.50 kg/m   Physical Exam  Constitutional: She appears well-developed and well-nourished. No distress.  HENT:  Head: Normocephalic and atraumatic.  Mouth/Throat: Oropharynx is clear and moist. No oropharyngeal exudate.  No obvious head injury.  No intraoral trauma.  Eyes: Pupils are equal, round, and reactive to light. EOM are normal.  Neck: Normal range of motion. Neck supple.  No meningismus  Cardiovascular: Normal rate and regular rhythm. Exam reveals no gallop and no friction rub.  Murmur heard. Pulmonary/Chest: Effort normal and breath sounds normal. No stridor. No respiratory distress. She has no wheezes. She has no rales. She exhibits no tenderness.  Abdominal: Soft. Bowel sounds are normal. There is no tenderness. There is no rebound and no guarding.  Musculoskeletal: Normal range of motion. She exhibits no edema or tenderness.  No lower extremity swelling, asymmetry or tenderness.  Distal pulses are 2+ in all extremities.  Neurological: She is alert.  Follows simple commands.  No facial asymmetry.  5/5 motor in all extremities.  Sensation to light touch intact.  Skin: Skin is warm and dry. Capillary refill takes less than 2 seconds. No rash noted. She is not diaphoretic. No erythema.  Psychiatric: She has a normal mood and affect. Her behavior is normal.  Nursing note and vitals reviewed.    ED Treatments / Results  Labs (all labs ordered are listed, but only abnormal results are displayed) Labs Reviewed  COMPREHENSIVE METABOLIC PANEL - Abnormal; Notable for the following components:      Result Value   Chloride 95 (*)    Glucose, Bld 128 (*)    BUN 26 (*)    GFR calc non Af Amer 56 (*)    All other components within normal limits  URINALYSIS, ROUTINE W  REFLEX MICROSCOPIC - Abnormal; Notable for the following components:   Leukocytes, UA SMALL (*)    Bacteria, UA RARE (*)    All other components within normal limits  URINE CULTURE  CBC WITH DIFFERENTIAL/PLATELET  ETHANOL  RAPID URINE DRUG SCREEN, HOSP PERFORMED  TROPONIN I    EKG EKG Interpretation  Date/Time:  Saturday May 17 2018 15:36:38 EDT Ventricular Rate:  70 PR Interval:    QRS Duration: 93 QT Interval:  398 QTC Calculation: 430 R Axis:   52 Text Interpretation:  Sinus rhythm Ventricular premature complex LVH with secondary repolarization abnormality Anterior Q waves, possibly due to LVH Confirmed by Julianne Rice [phone]) on 05/17/2018 3:53:10 PM   Radiology No results found.  Procedures Procedures (including critical care time)  Medications Ordered in ED Medications - No data to display   Initial Impression / Assessment and Plan / ED Course  I have reviewed the triage vital signs and the nursing notes.  Pertinent labs & imaging results that were available during my care of the patient were reviewed by me and considered in my medical decision making (see chart for details).     Emergency department work-up without acute findings.  Contaminated urine sample.  Was sent for culture.  Will have TTS evaluate patient. Evaluated by TTS.  Family is not wanting any "brain medications".  Psychiatry recommending no medications at this point.  Patient is supposed to go back to [place] in 1 week.  Strict return precautions have been given. Final Clinical Impressions(s) / ED Diagnoses   Final diagnoses:  Visual hallucinations    ED Discharge Orders    None       Julianne Rice, MD 05/17/18 2248

## 2018-05-17 NOTE — ED Triage Notes (Signed)
Per [facility]- Pt resides at home with family. Full code. Pt is deaf and speaks limited english. Family present.  Per family pt was unresponsive to verbal. Pt would respond to pain and open eyes. Upon EMS arrival, family states pt at her baseline. Denies recent illness. Per family pt having visual hallucinations.

## 2018-05-18 MED ORDER — HYDROCHLOROTHIAZIDE 25 MG PO TABS
25.0000 mg | ORAL_TABLET | Freq: Every day | ORAL | Status: DC
  Administered 2018-05-18: 25 mg via ORAL
  Filled 2018-05-18: qty 1

## 2018-05-18 MED ORDER — CLONIDINE HCL 0.1 MG PO TABS
0.1000 mg | ORAL_TABLET | Freq: Once | ORAL | Status: AC
  Administered 2018-05-18: 0.1 mg via ORAL
  Filled 2018-05-18: qty 1

## 2018-05-18 NOTE — ED Notes (Signed)
Pt discharged to home. Dc instructions given with son at bedside. No concerns voiced.

## 2018-05-18 NOTE — ED Provider Notes (Signed)
2:35 PM-nursing asked me to evaluate the patient and talk to family members because she has not worked since she was designated for discharge at 37 PM last night.  After that point she stayed here, and her blood pressure was high so she was treated with clonidine.  Her blood pressure is now normalized.  Apparently the patient was seen by TTS, who recommended medical evaluation as she did not have any acute psychiatric condition.  The ED provider that was managing her, did not feel further intervention was necessary and initiated the discharge.  At this time the patient's family members with her and is wondering why they have not left yet.  The patient has been eating and acting normally according to the family member.  He expresses no additional needs or concerns at this time.  He will take her home.  The patient plans on returning to [place] where she lives, next week.  Family member informed that they can return here as needed for problems.  ED chart, for this visit and prior visits reviewed.  Patient has had comprehensive evaluation and treatment for recurrent, intermittent periods of mental status.  Patient currently is at her baseline and has no need for further ED evaluation or hospitalization.   Daleen Bo, MD 05/18/18 1444

## 2018-05-18 NOTE — ED Notes (Signed)
Pt discharged to home. Left unit in wheelchair pushed by son. Left in stable condition. AVS signed by son.

## 2018-05-18 NOTE — ED Notes (Signed)
EDP Knapp made aware of BP 194/111.

## 2018-05-18 NOTE — ED Provider Notes (Signed)
I was called by nursing staff the patient's blood pressure was getting elevated.  Her home medications were not ordered.  Her blood pressure was 194/111.  She was given clonidine 0.1 mg orally and restarted back on her hydrochlorothiazide.   Rolland Porter, MD 05/18/18 0130

## 2018-05-18 NOTE — ED Notes (Addendum)
Pt with discharge orders. According to female family member at bedside, pt did not see a doctor last night when they requested one. Dr. Eulis Foster made aware. MD said to let family know that someone will be seeing them before they leave. Pt and family made aware.

## 2018-05-19 LAB — URINE CULTURE

## 2018-05-20 ENCOUNTER — Ambulatory Visit (INDEPENDENT_AMBULATORY_CARE_PROVIDER_SITE_OTHER): Payer: Self-pay | Admitting: Family Medicine

## 2018-05-20 VITALS — BP 130/90 | HR 87 | Temp 99.0°F | Ht 60.0 in | Wt 103.6 lb

## 2018-05-20 DIAGNOSIS — I161 Hypertensive emergency: Secondary | ICD-10-CM

## 2018-05-20 DIAGNOSIS — I10 Essential (primary) hypertension: Secondary | ICD-10-CM | POA: Insufficient documentation

## 2018-05-20 MED ORDER — HYDROCHLOROTHIAZIDE 25 MG PO TABS: 25.0000 mg | ORAL_TABLET | Freq: Every day | ORAL | 0 refills | Status: AC

## 2018-05-20 NOTE — Patient Instructions (Signed)
It was great seeing you today! We have addressed the following issues today  1. I send your blood pressure medication to your pharmacy keep taking one pill a day.  2. Make sure your maintain a low salt diet  3. Continue to stay active by walking daily 4. Follow up with your regular doctor once you return to [place].   If we did any lab work today, and the results require attention, either me or my nurse will get in touch with you. If everything is normal, you will get a letter in mail and a message via . If you don't hear from [place] in two weeks, please give [place] a call. Otherwise, we look forward to seeing you again at your next visit. If you have any questions or concerns before then, please call the clinic at [phone].  Please bring all your medications to every doctors visit  Sign up for My Chart to have easy access to your labs results, and communication with your Primary care physician. Please ask Front Desk for some assistance.   Please check-out at the front desk before leaving the clinic.    Take Care,   Dr. Andy Gauss

## 2018-05-20 NOTE — Progress Notes (Signed)
   Subjective:    Patient ID: Rachel Webb, female    DOB: 05-12-1933, 84 y.o.   MRN: [id number]   CC: Follow up for blood pressure   HPI: Patient is a 84 yo female with a past medical history significant for HTN who presents today for hospital follow up for hypertensive emergency. Patient was started on HCTZ 25 mg daily and has been doing well with it. She denies any headaches, vision changes, chest pain or shortness of breath. Patient will be returning to her home country of [place] in two weeks. She is currently watching her diet and is walking daily with family members. Patient was very active in [place].  Smoking status reviewed   ROS: all other systems were reviewed and are negative other than in the HPI   Past Medical History:  Diagnosis Date  . Deaf   . Hypertension     History reviewed. No pertinent surgical history.  Past medical history, surgical, family, and social history reviewed and updated in the EMR as appropriate.  Objective:  BP 130/90   Temp 99 F (37.2 C)   Wt 103 lb 9.6 oz (47 kg)   LMP  (LMP Unknown)   SpO2 100%   BMI 20.23 kg/m   Vitals and nursing note reviewed  General: NAD, pleasant, able to participate in exam Cardiac: RRR, normal heart sounds, no murmurs. 2+ radial and PT pulses bilaterally Respiratory: CTAB, normal effort, No wheezes, rales or rhonchi Abdomen: soft, nontender, nondistended, no hepatic or splenomegaly, +BS Extremities: no edema or cyanosis. WWP. Skin: warm and dry, no rashes noted Neuro: alert and oriented x4, no focal deficits Psych: Normal affect and mood   Assessment & Plan:   Essential hypertension Patient BP today 130/90 much improved from prior (SBP>200 and DBP>100). Patient has been adherent to her medications and is currently on HCTZ 25 mg daily. Patient has been asymptomatic since discharge. She is returning to her home country in two weeks where she will follow up with her PCP. I advice patient to continue with  daily walk, low salt diet and medications adherence. Patient and family members who are translating for her expressed understanding and are in agreement with the plan.    Marjie Skiff, MD [place] PGY-3

## 2018-05-20 NOTE — Assessment & Plan Note (Signed)
Patient BP today 130/90 much improved from prior (SBP>200 and DBP>100). Patient has been adherent to her medications and is currently on HCTZ 25 mg daily. Patient has been asymptomatic since discharge. She is returning to her home country in two weeks where she will follow up with her PCP. I advice patient to continue with daily walk, low salt diet and medications adherence. Patient and family members who are translating for her expressed understanding and are in agreement with the plan.

## 2018-06-06 ENCOUNTER — Ambulatory Visit: Payer: Self-pay | Admitting: Family Medicine

## 2020-04-23 IMAGING — MR MR HEAD W/O CM
10 of 11 series · 42 of 48 positions shown · non-contrast
Comparison: Prior CT from 05/08/2018

CLINICAL DATA: Initial evaluation for acute altered mental status.

EXAM:
MRI HEAD WITHOUT CONTRAST
TECHNIQUE: Multiplanar, multiecho pulse sequences of the brain and surrounding
structures were obtained without intravenous contrast.

[Series 5: DWI · axial · 4.0mm · 0.88mm/px · z∈[-108,+20]mm · 8 of 70 slices shown (1 of 4)]
[im 1/70]
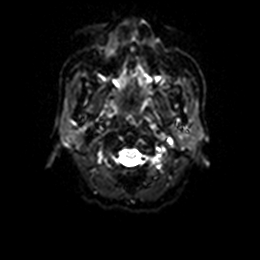
[im 10/70]
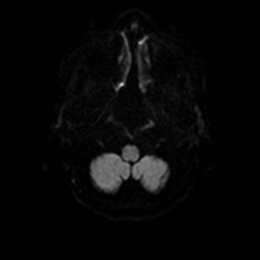
[im 20/70]
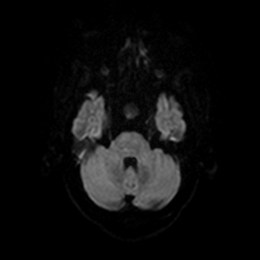
[im 30/70]
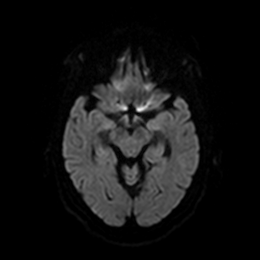
[im 40/70]
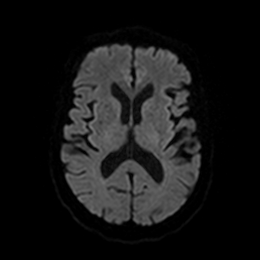
[im 50/70]
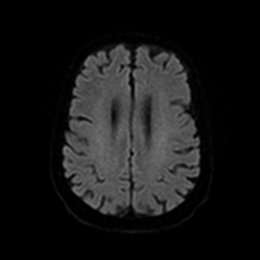
[im 60/70]
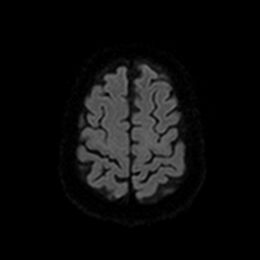
[im 70/70]
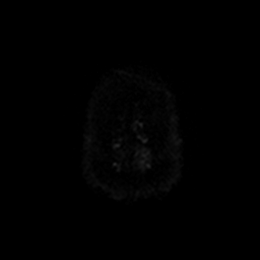

[Series 6: DWI · axial · 4.0mm · 0.88mm/px · z∈[-108,+20]mm · 4 of 35 slices shown (2 of 4)]
[im 1/35]
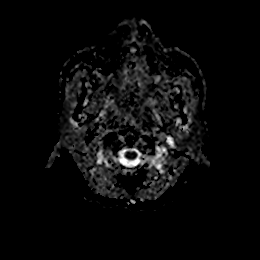
[im 12/35]
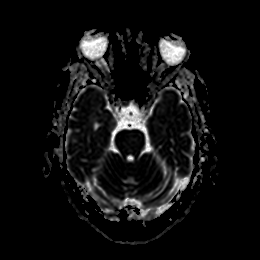
[im 23/35]
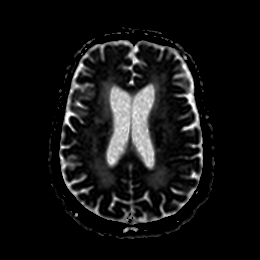
[im 35/35]
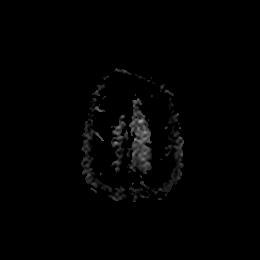

[Series 7: DWI · coronal · 4.0mm · 0.88mm/px · 7 of 68 slices shown (3 of 4)]
[im 1/68]
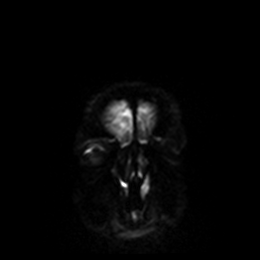
[im 12/68]
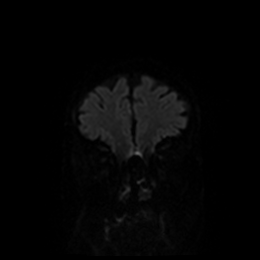
[im 23/68]
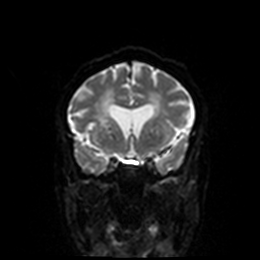
[im 34/68]
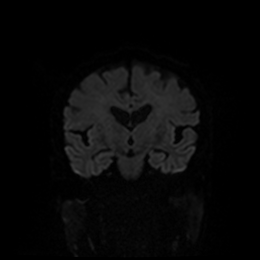
[im 45/68]
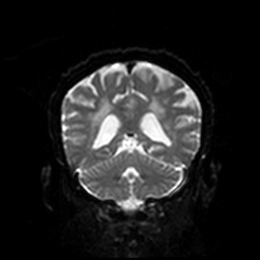
[im 56/68]
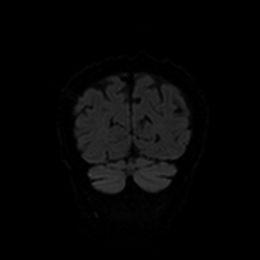
[im 68/68]
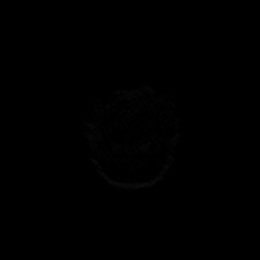

[Series 8: DWI · coronal · 4.0mm · 0.88mm/px · 3 of 34 slices shown (4 of 4)]
[im 1/34]
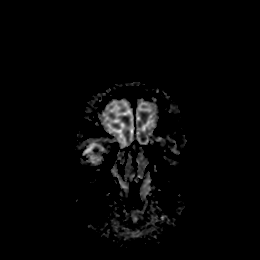
[im 17/34]
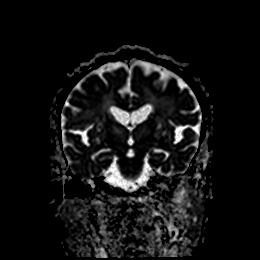
[im 34/34]
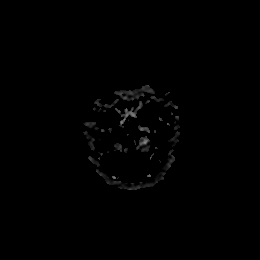

[Series 9: T1 · sagittal · 5.0mm · 0.75mm/px · 2 of 23 slices shown]
[im 1/23]
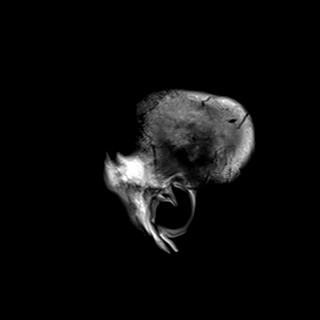
[im 23/23]
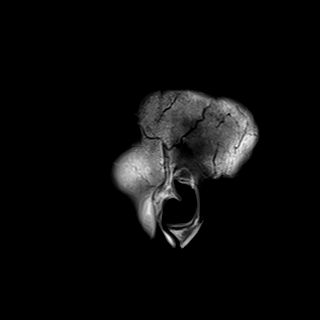

[Series 10: T2 · axial · 5.0mm · 0.72mm/px · z∈[-113,+22]mm · 2 of 25 slices shown (1 of 2)]
[im 1/25]
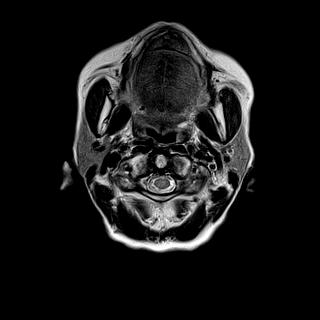
[im 25/25]
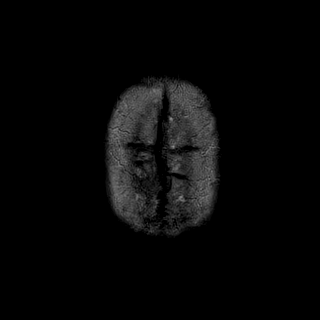

[Series 11: FLAIR · axial · 5.0mm · 0.45mm/px · z∈[-112,+24]mm · 2 of 25 slices shown]
[im 1/25]
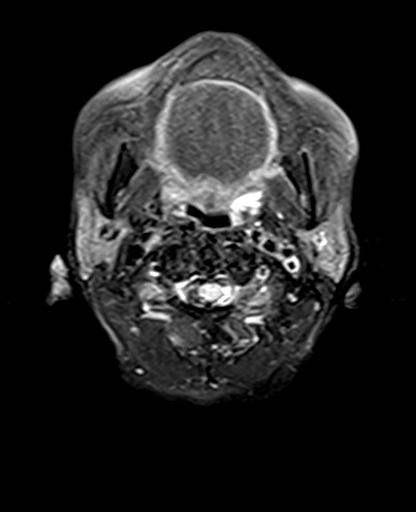
[im 25/25]
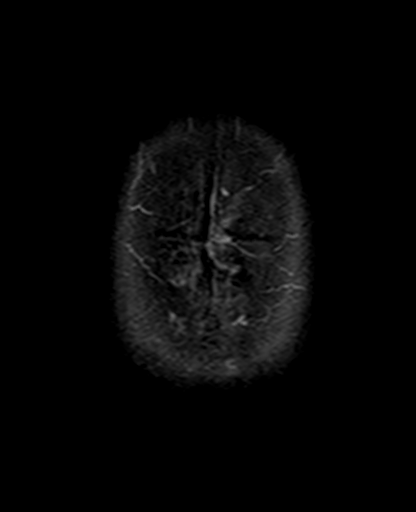

[Series 12: swi_images · axial · 3.0mm · 0.90mm/px · z∈[-119,+48]mm · 6 of 60 slices shown]
[im 1/60]
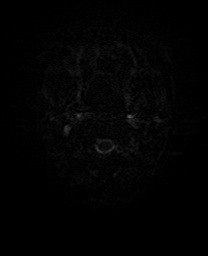
[im 12/60]
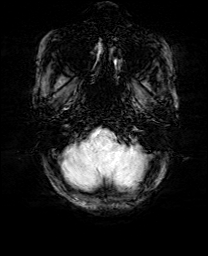
[im 24/60]
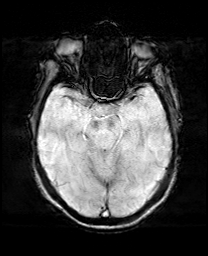
[im 36/60]
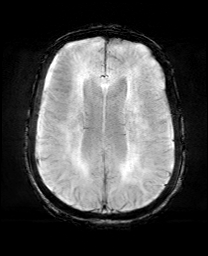
[im 48/60]
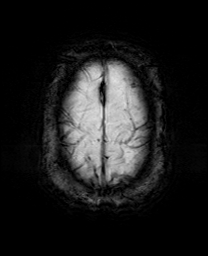
[im 60/60]
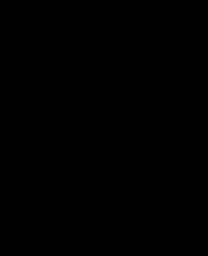

[Series 13: mip_images(sw) · axial · 24.0mm · 0.90mm/px · z∈[-109,+38]mm · 5 of 53 slices shown]
[im 1/53]
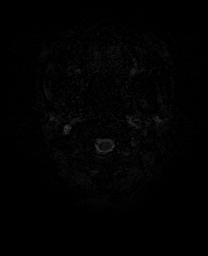
[im 14/53]
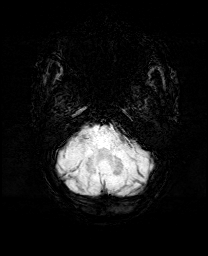
[im 27/53]
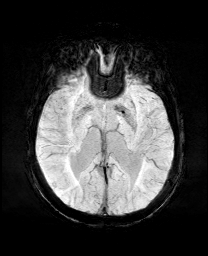
[im 40/53]
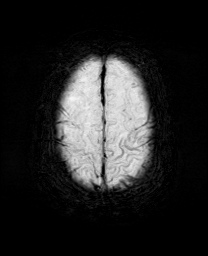
[im 53/53]
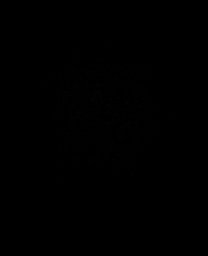

[Series 15: T2 · coronal · 5.0mm · 0.34mm/px · 3 of 29 slices shown (2 of 2)]
[im 1/29]
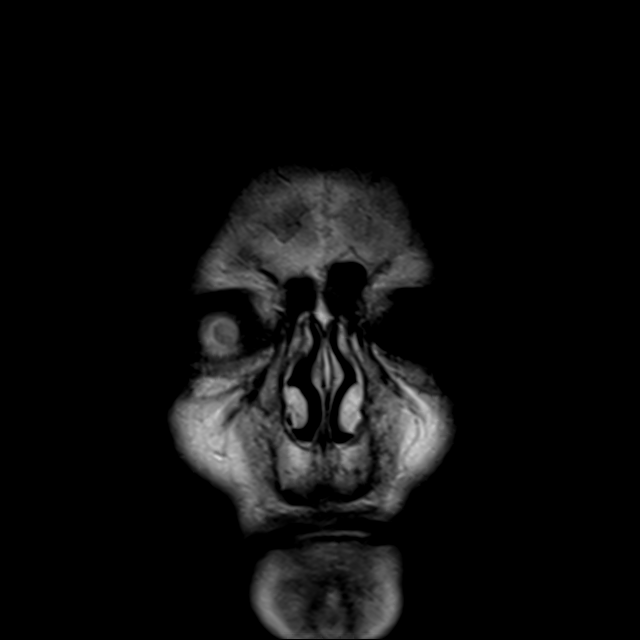
[im 15/29]
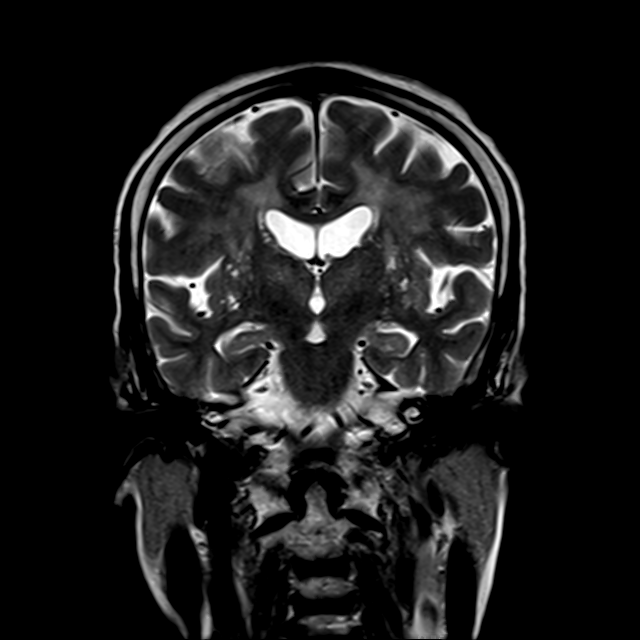
[im 29/29]
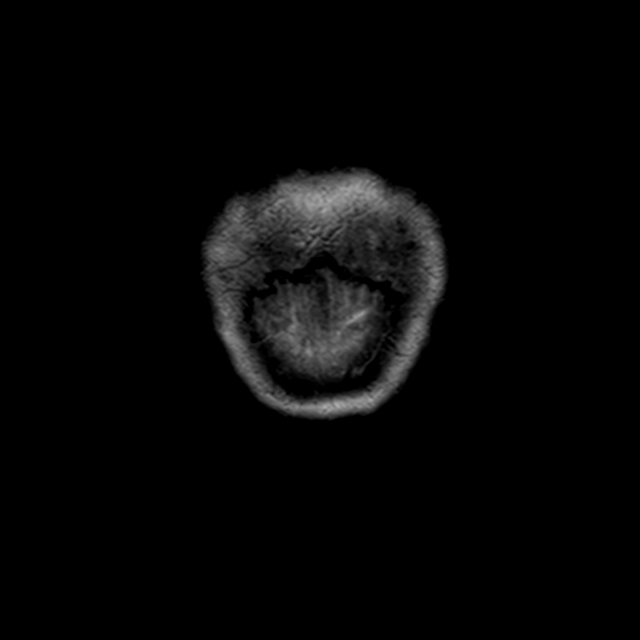

[42 of 48 positions shown; findings below may reference images not displayed]

FINDINGS: Brain: Generalized age-related cerebral atrophy. Patchy and
confluent T2/FLAIR hyperintensity within the periventricular deep
white matter both cerebral hemispheres consistent with chronic small
vessel ischemic disease, moderate in nature. Small superimposed
remote lacunar infarct within the bilateral basal ganglia.

No abnormal foci of restricted diffusion to suggest acute or
subacute ischemia. Gray-white matter differentiation maintained. No
evidence for remote cortical infarction. No foci of susceptibility
artifact to suggest acute or chronic intracranial hemorrhage.

14 x 14 x 14 mm meningioma seen positioned along the
anterior/inferior falx at the level of the right gyrus rectus
(series 10, image 12). Mild localized vasogenic edema within the
adjacent anterior inferior right frontal lobe without significant
regional mass effect. No other mass lesion. No midline shift. No
hydrocephalus. No extra-axial fluid collection. Pituitary gland
normal.

Vascular: Major intracranial vascular flow voids maintained.

Skull and upper cervical spine: Craniocervical junction within
normal limits. Multilevel degenerate spondylolysis within the upper
cervical spine without significant stenosis. Bone marrow signal
intensity within normal limits. No scalp soft tissue abnormality.

Sinuses/Orbits: Globes and orbital soft tissues within normal
limits. Paranasal sinuses are clear. No significant mastoid
effusion. Inner ear structures grossly normal.

Other: None.
IMPRESSION: 1. No acute intracranial abnormality.
2. Age-related cerebral atrophy with moderate chronic small vessel
ischemic disease.
3. 14 mm meningioma along the anterior/inferior falx. Mild localized
vasogenic edema without significant regional mass effect.
